# Patient Record
Sex: Male | Born: 2005 | Race: White | Hispanic: No | Marital: Single | Smoking: Never smoker
Health system: Southern US, Community
[De-identification: ages and names within clinical notes are randomized; demographics above are authoritative.]

## PROBLEM LIST (undated history)

## (undated) HISTORY — PX: COLON SURGERY: SHX602

---
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
## 2024-01-30 ENCOUNTER — Ambulatory Visit
Admission: EM | Admit: 2024-01-30 | Discharge: 2024-01-30 | Disposition: A | Discharge status: Home / Self Care | Attending: Nurse Practitioner | Admitting: Nurse Practitioner

## 2024-01-30 ENCOUNTER — Encounter: Payer: Self-pay | Admitting: Emergency Medicine

## 2024-01-30 ENCOUNTER — Ambulatory Visit

## 2024-01-30 DIAGNOSIS — S62323A Displaced fracture of shaft of third metacarpal bone, left hand, initial encounter for closed fracture: Secondary | ICD-10-CM

## 2024-01-30 MED ORDER — TRAMADOL HCL 50 MG PO TABS: 50.0000 mg | ORAL_TABLET | Freq: Two times a day (BID) | ORAL | 0 refills | Status: AC | PRN

## 2024-01-30 NOTE — Discharge Instructions (Addendum)
 The x-ray shows you have broken a bone in your left hand.  A splint has been placed to allow for additional immobilization and support.  Keep the splint in place until you have been evaluated by orthopedics. Take medication as prescribed.  Recommend taking the tramadol  for more severe pain. RICE therapy, rest, ice, compression, and elevation.  Apply ice for 20 minutes, remove for 1 hour, repeat as needed while symptoms persist. As discussed, I would like for you to follow-up with orthopedics on 01/31/2024.  A referral has been placed for you to follow-up with [facility], you can call to schedule an appointment for tomorrow.  If you are unable to be seen at [facility], you can follow-up at the other Ortho care locations or follow-up at [facility]. Follow-up as needed.

## 2024-01-30 NOTE — ED Provider Notes (Signed)
 [facility]    CSN: [id number] Arrival date & time: 01/30/24  1750      History   Chief Complaint No chief complaint on file.   HPI Luis Oconnell is a 18 y.o. male.   The history is provided by the patient.   Patient presents for complaints of pain and swelling and bruising to the left hand.  Patient states symptoms started approximately 2 days ago.  Patient's girlfriend states that they were arguing over her phone.  Patient states that he thinks he may have reached for something and jammed his hand.  He complains of bruising to the palm of the hand into the backside of the hand.  He also states that he has swelling to his fingers.  He reports that he has increased pain in the left middle finger.  Patient denies numbness, tingling, or radiation of pain.  States that he is right-hand dominant.  Further states that he has been taking Tylenol for his symptoms.  History reviewed. No pertinent past medical history.  Patient Active Problem List   Diagnosis Date Noted   GAD (generalized anxiety disorder) 08/01/2021   Current moderate episode of major depressive disorder without prior episode (HCC) 08/01/2021   Panic disorder 08/01/2021    Past Surgical History:  Procedure Laterality Date   COLON SURGERY         Home Medications    Prior to Admission medications  Medication Sig Start Date End Date Taking? Authorizing Provider  traMADol  (ULTRAM ) 50 MG tablet Take 1 tablet (50 mg total) by mouth every 12 (twelve) hours as needed. 01/30/24  Yes Leath-Warren, Etta PARAS, NP    Family History History reviewed. No pertinent family history.  Social History Social History[1]   Allergies   Patient has no known allergies.   Review of Systems Review of Systems Per HPI  Physical Exam Triage Vital Signs ED Triage Vitals  Encounter Vitals Group     BP 01/30/24 1808 136/73     Girls Systolic BP Percentile --      Girls Diastolic BP Percentile --      Boys  Systolic BP Percentile --      Boys Diastolic BP Percentile --      Pulse Rate 01/30/24 1808 61     Resp 01/30/24 1808 18     Temp 01/30/24 1808 98.2 F (36.8 C)     Temp Source 01/30/24 1808 Oral     SpO2 01/30/24 1808 97 %     Weight --      Height --      Head Circumference --      Peak Flow --      Pain Score 01/30/24 1809 4     Pain Loc --      Pain Education --      Exclude from Growth Chart --    No data found.  Updated Vital Signs BP 136/73 (BP Location: Right Arm)   Pulse 61   Temp 98.2 F (36.8 C) (Oral)   Resp 18   SpO2 97%   Visual Acuity Right Eye Distance:   Left Eye Distance:   Bilateral Distance:    Right Eye Near:   Left Eye Near:    Bilateral Near:     Physical Exam Vitals and nursing note reviewed.  Constitutional:      General: He is not in acute distress.    Appearance: Normal appearance.  HENT:     Head: Normocephalic.  Eyes:  Extraocular Movements: Extraocular movements intact.     Pupils: Pupils are equal, round, and reactive to light.  Pulmonary:     Effort: Pulmonary effort is normal.  Musculoskeletal:     Left hand: Swelling and tenderness present. Decreased range of motion. Normal capillary refill. Normal pulse.     Comments: Generalized swelling noted to the left hand with decreased range of motion to all digits along with tenderness.  Bruising noted to the palmar aspect and dorsal aspect of the left hand.    Skin:    General: Skin is warm and dry.  Neurological:     General: No focal deficit present.     Mental Status: He is alert and oriented to person, place, and time.  Psychiatric:        Mood and Affect: Mood normal.        Behavior: Behavior normal.      UC Treatments / Results  Labs (all labs ordered are listed, but only abnormal results are displayed) Labs Reviewed - No data to display  EKG   Radiology DG Hand Complete Left Result Date: 01/30/2024 EXAM: 3 OR MORE VIEW(S) XRAY OF THE LEFT HAND 01/30/2024  06:18:45 PM COMPARISON: None available. CLINICAL HISTORY: jammed left hand 2 days ago. pain and swelling FINDINGS: BONES AND JOINTS: Acute oblique fracture through the mid shaft of the third metacarpal with 2 mm of radial displacement. No malalignment. SOFT TISSUES: Dorsal soft tissue swelling. IMPRESSION: 1. Acute oblique fracture through the mid shaft of the third metacarpal. 2. Dorsal soft tissue swelling. Electronically signed by: Greig Pique MD 01/30/2024 06:34 PM EST RP Workstation: [id number]    Procedures Procedures (including critical care time)  Medications Ordered in UC Medications - No data to display  Initial Impression / Assessment and Plan / UC Course  I have reviewed the triage vital signs and the nursing notes.  Pertinent labs & imaging results that were available during my care of the patient were reviewed by me and considered in my medical decision making (see chart for details).  X-ray of the left hand shows an oblique fracture through the midshaft of the third metacarpal.  Radial gutter splint was applied.  Will provide pain relief with tramadol  50 mg.  Supportive care recommendations were provided and discussed with the patient to include over-the-counter Tylenol or ibuprofen for less severe pain, RICE therapy, and follow-up with orthopedics within the next 24 hours for further evaluation.  Patient was given information for [facility], referral was placed and for [facility].  Patient was in agreement with this plan of care and verbalizes understanding.  All questions were answered.  Patient stable for discharge.  Work note and school notes were provided.   Final Clinical Impressions(s) / UC Diagnoses   Final diagnoses:  Closed displaced fracture of shaft of third metacarpal bone of left hand, initial encounter     Discharge Instructions      The x-ray shows you have broken a bone in your left hand.  A splint has been placed to allow for additional  immobilization and support.  Keep the splint in place until you have been evaluated by orthopedics. Take medication as prescribed.  Recommend taking the tramadol  for more severe pain. RICE therapy, rest, ice, compression, and elevation.  Apply ice for 20 minutes, remove for 1 hour, repeat as needed while symptoms persist. As discussed, I would like for you to follow-up with orthopedics on 01/31/2024.  A referral has been placed for you to  follow-up with [facility] [place], you can call to schedule an appointment for tomorrow.  If you are unable to be seen at [facility], you can follow-up at the other Ortho care locations or follow-up at [facility]. Follow-up as needed.     ED Prescriptions     Medication Sig Dispense Auth. Provider   traMADol  (ULTRAM ) 50 MG tablet Take 1 tablet (50 mg total) by mouth every 12 (twelve) hours as needed. 8 tablet Leath-Warren, Etta PARAS, NP      I have reviewed the PDMP during this encounter.    [1]  Social History Tobacco Use   Smoking status: Never   Smokeless tobacco: Never  Vaping Use   Vaping status: Former     Gilmer Etta PARAS, NP 01/30/24 1849

## 2024-01-30 NOTE — ED Triage Notes (Signed)
 Left hand pain x 2 days.  Hand swollen and bruised  States he thinks he jammed his hand.
